# Patient Record
Sex: Male | Born: 1935 | Race: White | Hispanic: No | Smoking: Never smoker
Health system: Southern US, Community
[De-identification: ages and names within clinical notes are randomized; demographics above are authoritative.]

## PROBLEM LIST (undated history)

## (undated) DIAGNOSIS — K635 Polyp of colon: Secondary | ICD-10-CM

## (undated) DIAGNOSIS — K648 Other hemorrhoids: Secondary | ICD-10-CM

## (undated) DIAGNOSIS — I10 Essential (primary) hypertension: Secondary | ICD-10-CM

## (undated) DIAGNOSIS — T7840XA Allergy, unspecified, initial encounter: Secondary | ICD-10-CM

## (undated) HISTORY — DX: Essential (primary) hypertension: I10

## (undated) HISTORY — DX: Polyp of colon: K63.5

## (undated) HISTORY — DX: Other hemorrhoids: K64.8

## (undated) HISTORY — PX: POLYPECTOMY: SHX149

## (undated) HISTORY — DX: Allergy, unspecified, initial encounter: T78.40XA

## (undated) HISTORY — PX: COLONOSCOPY: SHX174

## (undated) HISTORY — PX: LEG AMPUTATION BELOW KNEE: SHX694

---

## 1998-02-18 ENCOUNTER — Emergency Department (HOSPITAL_COMMUNITY): Admission: EM | Admit: 1998-02-18 | Discharge: 1998-02-18 | Payer: Self-pay

## 1998-02-20 ENCOUNTER — Emergency Department (HOSPITAL_COMMUNITY): Admission: EM | Admit: 1998-02-20 | Discharge: 1998-02-20 | Payer: Self-pay | Admitting: Family Medicine

## 1998-03-01 ENCOUNTER — Emergency Department (HOSPITAL_COMMUNITY): Admission: EM | Admit: 1998-03-01 | Discharge: 1998-03-01 | Payer: Self-pay | Admitting: Emergency Medicine

## 2001-05-03 ENCOUNTER — Ambulatory Visit (HOSPITAL_COMMUNITY): Admission: RE | Admit: 2001-05-03 | Discharge: 2001-05-03 | Payer: Self-pay | Admitting: Internal Medicine

## 2001-05-03 ENCOUNTER — Encounter (HOSPITAL_BASED_OUTPATIENT_CLINIC_OR_DEPARTMENT_OTHER): Payer: Self-pay | Admitting: Internal Medicine

## 2002-05-09 ENCOUNTER — Encounter: Admission: RE | Admit: 2002-05-09 | Discharge: 2002-05-09 | Payer: Self-pay | Admitting: Internal Medicine

## 2002-05-09 ENCOUNTER — Encounter: Payer: Self-pay | Admitting: Internal Medicine

## 2002-05-21 ENCOUNTER — Encounter: Admission: RE | Admit: 2002-05-21 | Discharge: 2002-05-21 | Payer: Self-pay | Admitting: Urology

## 2002-05-21 ENCOUNTER — Encounter: Payer: Self-pay | Admitting: Urology

## 2003-08-17 ENCOUNTER — Encounter: Admission: RE | Admit: 2003-08-17 | Discharge: 2003-08-17 | Payer: Self-pay | Admitting: Family Medicine

## 2003-08-19 ENCOUNTER — Encounter: Admission: RE | Admit: 2003-08-19 | Discharge: 2003-08-19 | Payer: Self-pay | Admitting: Family Medicine

## 2004-03-01 ENCOUNTER — Ambulatory Visit: Payer: Self-pay | Admitting: Internal Medicine

## 2004-03-17 ENCOUNTER — Ambulatory Visit: Payer: Self-pay | Admitting: Internal Medicine

## 2004-05-02 ENCOUNTER — Ambulatory Visit: Payer: Self-pay | Admitting: Internal Medicine

## 2004-05-09 ENCOUNTER — Ambulatory Visit: Payer: Self-pay | Admitting: Internal Medicine

## 2004-05-25 ENCOUNTER — Ambulatory Visit: Payer: Self-pay | Admitting: Internal Medicine

## 2004-06-03 ENCOUNTER — Ambulatory Visit: Payer: Self-pay | Admitting: Internal Medicine

## 2005-02-15 ENCOUNTER — Ambulatory Visit: Payer: Self-pay | Admitting: Internal Medicine

## 2005-05-05 ENCOUNTER — Ambulatory Visit: Payer: Self-pay | Admitting: Internal Medicine

## 2005-05-12 ENCOUNTER — Ambulatory Visit: Payer: Self-pay | Admitting: Internal Medicine

## 2005-05-24 ENCOUNTER — Ambulatory Visit: Payer: Self-pay | Admitting: Internal Medicine

## 2005-06-05 ENCOUNTER — Ambulatory Visit: Payer: Self-pay | Admitting: Internal Medicine

## 2005-06-05 ENCOUNTER — Encounter (INDEPENDENT_AMBULATORY_CARE_PROVIDER_SITE_OTHER): Payer: Self-pay | Admitting: *Deleted

## 2005-06-14 ENCOUNTER — Ambulatory Visit: Payer: Self-pay | Admitting: Internal Medicine

## 2005-10-24 ENCOUNTER — Ambulatory Visit: Payer: Self-pay | Admitting: Internal Medicine

## 2005-12-28 IMAGING — US US ABDOMEN COMPLETE
1 series · 13 of 25 positions shown · non-contrast
Comparison: none

CLINICAL DATA: Abdominal pain. 
ABDOMINAL ULTRASOUND ? 08/17/03 
Comparison ? none.
TECHNIQUE: Realtime multiplanar gray scale ultrasonography of the abdomen was performed.

[Series 1: unknown · 0.32mm/px · 13 of 61 slices shown]
[im 1/61]
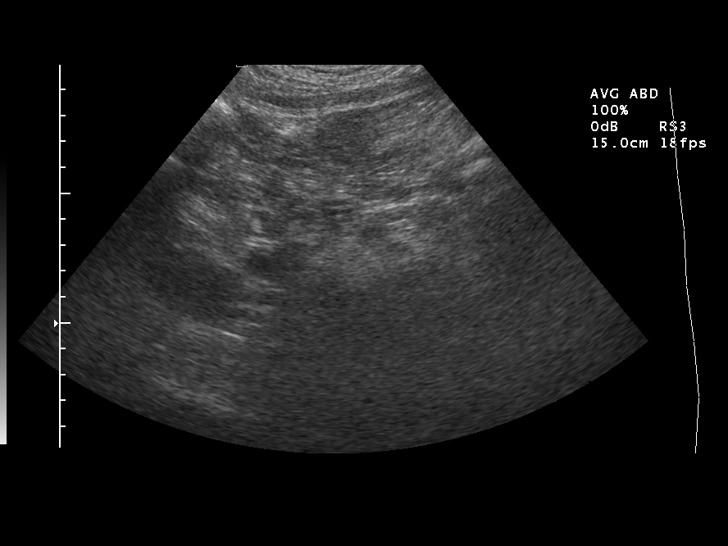
[im 6/61]
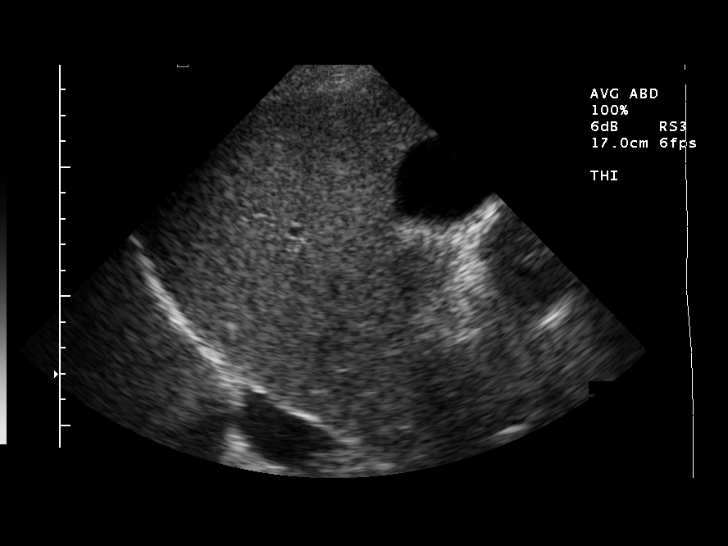
[im 11/61]
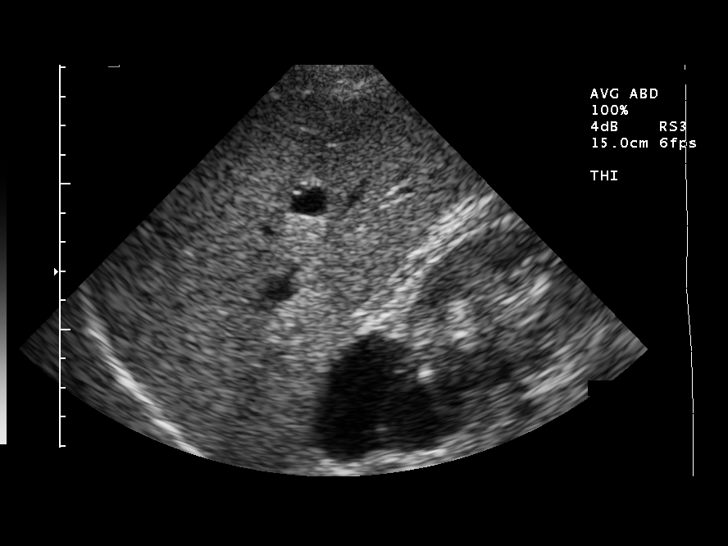
[im 16/61]
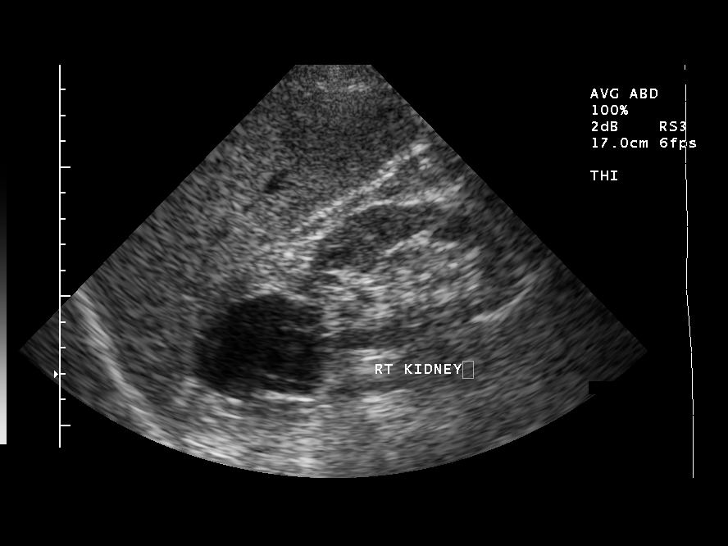
[im 21/61]
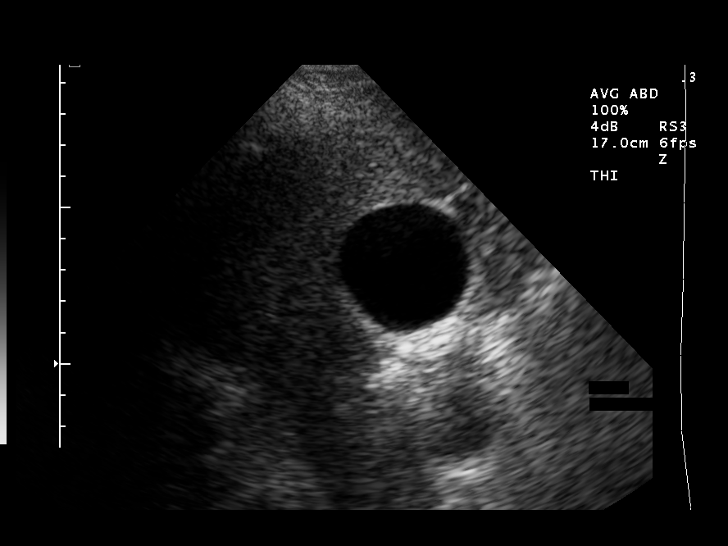
[im 26/61]
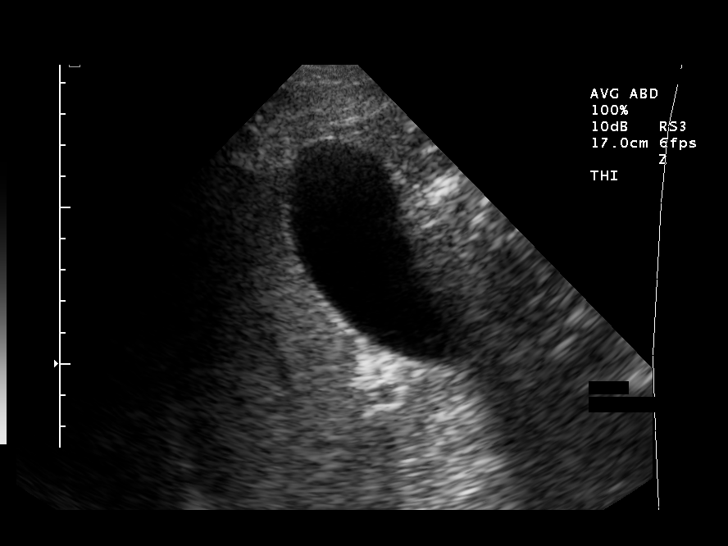
[im 31/61]
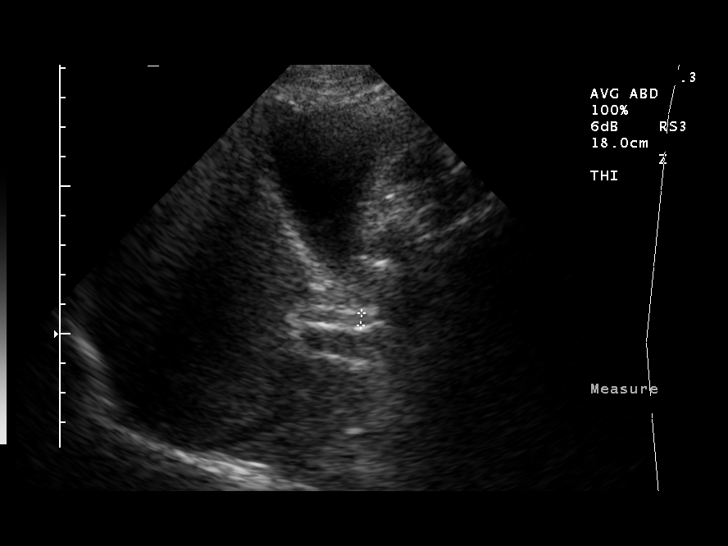
[im 36/61]
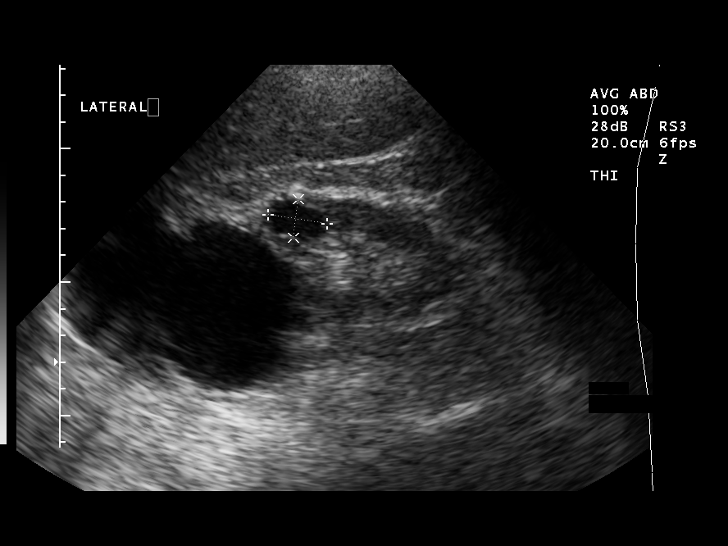
[im 41/61]
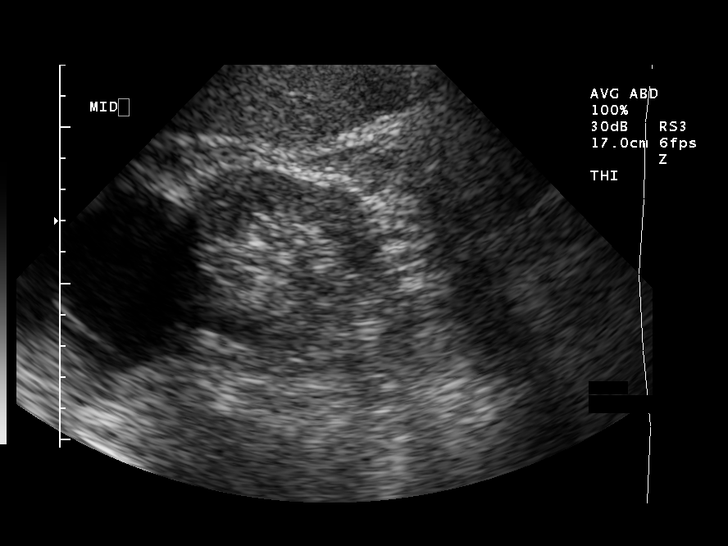
[im 46/61]
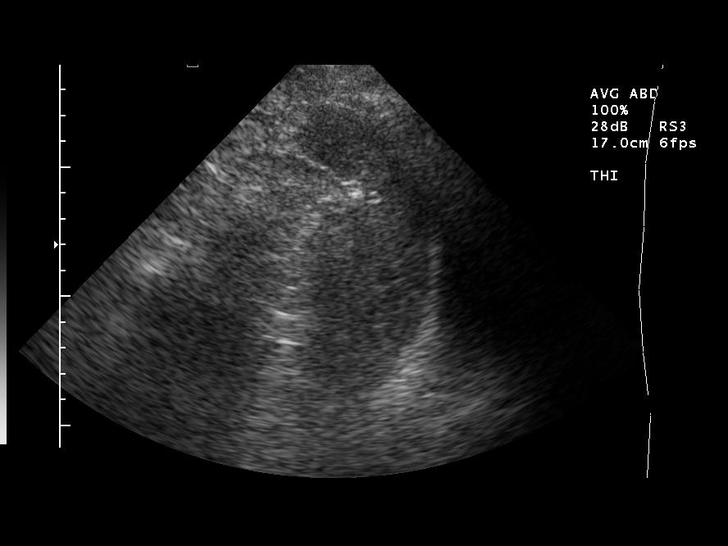
[im 51/61]
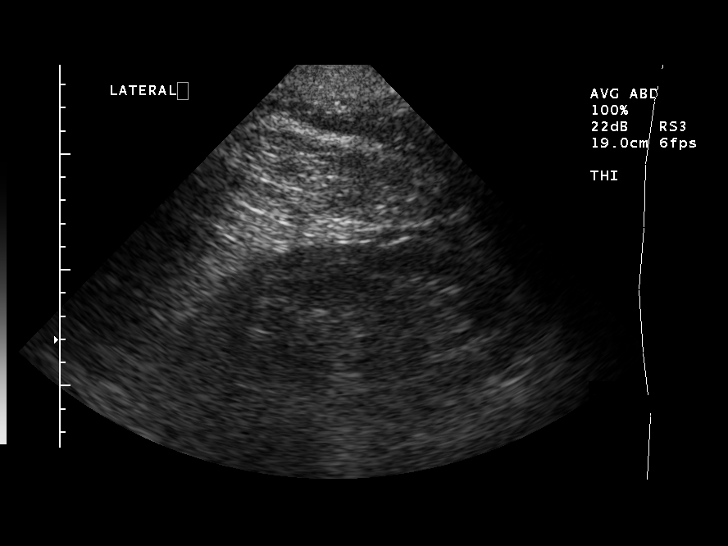
[im 56/61]
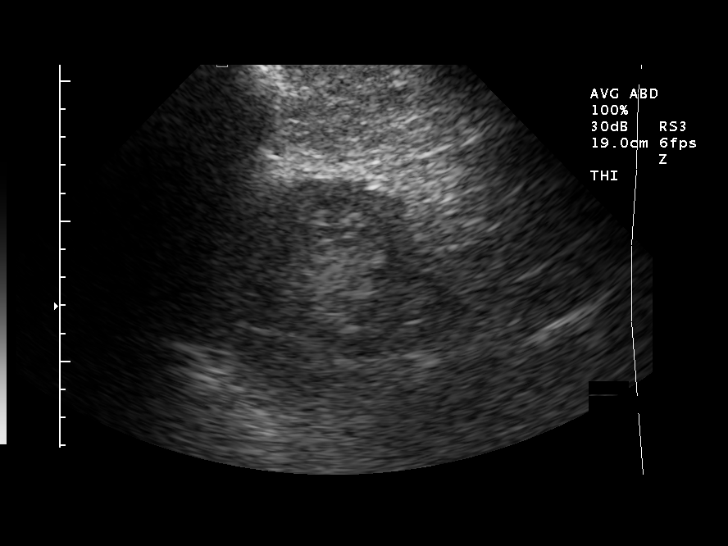
[im 61/61]
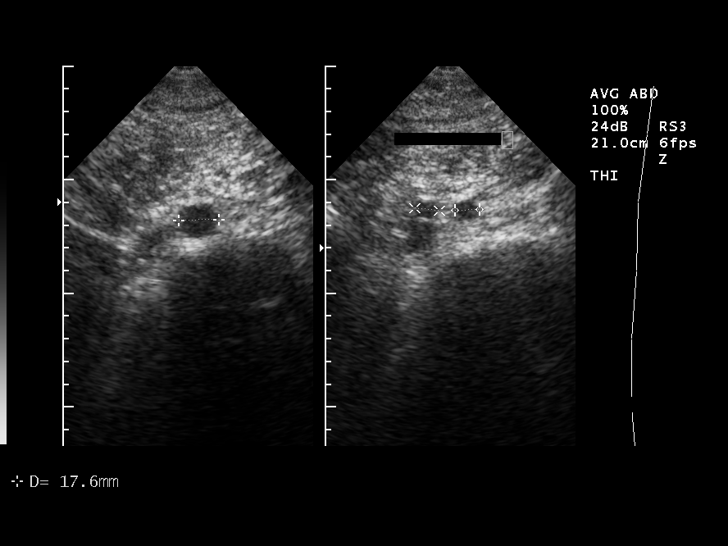

[13 of 25 positions shown; findings below may reference images not displayed]

FINDINGS: A 12 mm cyst is identified in the central liver without other focal intrahepatic parenchymal abnormality.  
Evaluation of the right kidney demonstrates a multilocular cystic lesion in the upper pole measuring 9.6 x 7.4 cm which was larger than the dimensions described on a CT report from 05/21/02.  A second 2.2 cm hypoechoic lesion is identified in the interpolar region of the right kidney.  The left kidney measures 12-13 cm on long axis.  There is no evidence for hydronephrosis on either side. 
Pancreas is obscured by overlying bowel gas.  The cranial portions of the inferior vena cava are unremarkable.  No evidence for abdominal aortic aneurysm.  No focal abnormality identified in the spleen.
Gallbladder has normal features without evidence for gallbladder wall thickening or pericholecystic fluid.  The sonographer measured the gallbladder wall on a longitudinal image with an upper limits of normal measurement, but this may be related to tangential imaging secondary to curvature of the gallbladder wall. 
IMPRESSION
1.  Simple cyst in the hepatic parenchyma.  The gallbladder has normal features.  There is no biliary dilatation.
2.  Large complex cystic lesion in the upper pole of the right kidney has increased in size when compared to measurements from a prior CT scan.  Repeat CT before and after contrast is recommended to further evaluate and allow comparison in the context of the earlier index study.

## 2006-02-22 ENCOUNTER — Ambulatory Visit: Payer: Self-pay | Admitting: Internal Medicine

## 2006-05-07 ENCOUNTER — Ambulatory Visit: Payer: Self-pay | Admitting: Internal Medicine

## 2006-05-07 LAB — CONVERTED CEMR LAB
ALT: 15 units/L (ref 0–40)
AST: 21 units/L (ref 0–37)
Albumin: 3.5 g/dL (ref 3.5–5.2)
Alkaline Phosphatase: 41 units/L (ref 39–117)
BUN: 15 mg/dL (ref 6–23)
Basophils Absolute: 0.3 10*3/uL — ABNORMAL HIGH (ref 0.0–0.1)
Basophils Relative: 2.9 % — ABNORMAL HIGH (ref 0.0–1.0)
CO2: 31 meq/L (ref 19–32)
Calcium: 9.2 mg/dL (ref 8.4–10.5)
Chloride: 104 meq/L (ref 96–112)
Cholesterol: 147 mg/dL (ref 0–200)
Creatinine, Ser: 1.4 mg/dL (ref 0.4–1.5)
Eosinophils Relative: 2 % (ref 0.0–5.0)
GFR calc Af Amer: 64 mL/min
GFR calc non Af Amer: 53 mL/min
Glucose, Bld: 147 mg/dL — ABNORMAL HIGH (ref 70–99)
HCT: 46.9 % (ref 39.0–52.0)
HDL: 33.1 mg/dL — ABNORMAL LOW (ref >39.0)
Hemoglobin: 16.2 g/dL (ref 13.0–17.0)
Hgb A1c MFr Bld: 5.7 % (ref 4.6–6.0)
LDL Cholesterol: 90 mg/dL (ref 0–99)
Lymphocytes Relative: 22.2 % (ref 12.0–46.0)
MCHC: 34.6 g/dL (ref 30.0–36.0)
MCV: 95.9 fL (ref 78.0–100.0)
Monocytes Absolute: 0.4 10*3/uL (ref 0.2–0.7)
Monocytes Relative: 4.6 % (ref 3.0–11.0)
Neutro Abs: 5.9 10*3/uL (ref 1.4–7.7)
Neutrophils Relative %: 68.3 % (ref 43.0–77.0)
PSA: 1.71 ng/mL (ref 0.10–4.00)
Platelets: 189 10*3/uL (ref 150–400)
Potassium: 4.1 meq/L (ref 3.5–5.1)
RBC: 4.9 M/uL (ref 4.22–5.81)
RDW: 12.7 % (ref 11.5–14.6)
Sodium: 140 meq/L (ref 135–145)
TSH: 2.08 microintl units/mL (ref 0.35–5.50)
Total Bilirubin: 0.9 mg/dL (ref 0.3–1.2)
Total CHOL/HDL Ratio: 4.4
Total Protein: 7.1 g/dL (ref 6.0–8.3)
Triglycerides: 121 mg/dL (ref 0–149)
VLDL: 24 mg/dL (ref 0–40)
WBC: 8.8 10*3/uL (ref 4.5–10.5)

## 2006-05-14 ENCOUNTER — Ambulatory Visit: Payer: Self-pay | Admitting: Internal Medicine

## 2006-07-09 ENCOUNTER — Ambulatory Visit: Payer: Self-pay | Admitting: Internal Medicine

## 2006-07-09 LAB — CONVERTED CEMR LAB
CRP, High Sensitivity: 1 (ref 0.00–5.00)
Glucose, Bld: 99 mg/dL (ref 70–99)

## 2006-07-16 ENCOUNTER — Ambulatory Visit: Payer: Self-pay | Admitting: Internal Medicine

## 2006-11-01 DIAGNOSIS — I10 Essential (primary) hypertension: Secondary | ICD-10-CM | POA: Insufficient documentation

## 2006-12-31 ENCOUNTER — Ambulatory Visit: Payer: Self-pay | Admitting: Internal Medicine

## 2006-12-31 DIAGNOSIS — J309 Allergic rhinitis, unspecified: Secondary | ICD-10-CM | POA: Insufficient documentation

## 2006-12-31 DIAGNOSIS — G4700 Insomnia, unspecified: Secondary | ICD-10-CM

## 2007-01-18 ENCOUNTER — Ambulatory Visit: Payer: Self-pay | Admitting: Internal Medicine

## 2007-03-15 ENCOUNTER — Telehealth: Payer: Self-pay | Admitting: Internal Medicine

## 2007-04-22 ENCOUNTER — Telehealth: Payer: Self-pay | Admitting: Internal Medicine

## 2007-05-13 ENCOUNTER — Ambulatory Visit: Payer: Self-pay | Admitting: Internal Medicine

## 2007-05-13 LAB — CONVERTED CEMR LAB
ALT: 18 units/L (ref 0–53)
Alkaline Phosphatase: 38 units/L — ABNORMAL LOW (ref 39–117)
BUN: 17 mg/dL (ref 6–23)
Basophils Absolute: 0 10*3/uL (ref 0.0–0.1)
Bilirubin Urine: NEGATIVE
Calcium: 9.1 mg/dL (ref 8.4–10.5)
Cholesterol: 166 mg/dL (ref 0–200)
Eosinophils Absolute: 0.2 10*3/uL (ref 0.0–0.6)
GFR calc Af Amer: 84 mL/min
GFR calc non Af Amer: 69 mL/min
HDL: 26.7 mg/dL — ABNORMAL LOW (ref >39.0)
Hemoglobin: 15.8 g/dL (ref 13.0–17.0)
Ketones, urine, test strip: NEGATIVE
Lymphocytes Relative: 28.6 % (ref 12.0–46.0)
MCHC: 34 g/dL (ref 30.0–36.0)
MCV: 96.6 fL (ref 78.0–100.0)
Monocytes Absolute: 0.4 10*3/uL (ref 0.2–0.7)
Monocytes Relative: 6.5 % (ref 3.0–11.0)
Neutro Abs: 4 10*3/uL (ref 1.4–7.7)
Nitrite: NEGATIVE
Platelets: 187 10*3/uL (ref 150–400)
Potassium: 4.6 meq/L (ref 3.5–5.1)
Specific Gravity, Urine: 1.02
TSH: 1.83 microintl units/mL (ref 0.35–5.50)
Total Protein: 6.7 g/dL (ref 6.0–8.3)
Triglycerides: 146 mg/dL (ref 0–149)
VLDL: 29 mg/dL (ref 0–40)
pH: 7

## 2007-05-15 ENCOUNTER — Encounter: Payer: Self-pay | Admitting: Internal Medicine

## 2007-05-15 LAB — CONVERTED CEMR LAB: CRP: 0.2 mg/dL (ref <0.6)

## 2007-05-17 ENCOUNTER — Ambulatory Visit: Payer: Self-pay | Admitting: Internal Medicine

## 2007-05-22 ENCOUNTER — Telehealth: Payer: Self-pay | Admitting: Internal Medicine

## 2007-06-20 ENCOUNTER — Telehealth: Payer: Self-pay | Admitting: Internal Medicine

## 2007-06-24 ENCOUNTER — Telehealth: Payer: Self-pay | Admitting: Internal Medicine

## 2010-05-17 ENCOUNTER — Encounter: Payer: Self-pay | Admitting: Internal Medicine

## 2010-05-23 ENCOUNTER — Other Ambulatory Visit: Payer: Self-pay | Admitting: Dermatology

## 2010-05-25 NOTE — Letter (Signed)
Summary: Colonoscopy Letter  [facility]  [address] [place], [place] [address]   Phone: [phone]  Fax: [phone]      May 17, 2010 MRN: [id number]   Ricky West [address] [place], [place]  [address]   Dear Mr. Hahne,   According to your medical record, it is time for you to schedule a Colonoscopy. The American Cancer Society recommends this procedure as a method to detect early colon cancer. Patients with a family history of colon cancer, or a personal history of colon polyps or inflammatory bowel disease are at increased risk.  This letter has been generated based on the recommendations made at the time of your procedure. If you feel that in your particular situation this may no longer apply, please contact our office.  Please call our office at [phone] to schedule this appointment or to update your records at your earliest convenience.  Thank you for cooperating with [place] to provide you with the very best care possible.   Sincerely,  Wilhemina Bonito. Marina Goodell, M.D.  [facility] [phone]

## 2010-06-16 ENCOUNTER — Encounter (INDEPENDENT_AMBULATORY_CARE_PROVIDER_SITE_OTHER): Payer: Self-pay | Admitting: *Deleted

## 2010-06-23 NOTE — Letter (Signed)
Summary: Pre Visit Letter Revised  [facility]  [address] [place], [place] [address]   Phone: [phone]  Fax: [phone]        06/16/2010 MRN: [id number] [facility] [address] [place], [place]  [address]             Procedure Date:  08-01-10           Recall Colon---Dr. Marina Goodell   Welcome to the Gastroenterology Division at [facility].    You are scheduled to see a nurse for your pre-procedure visit on 07-19-10 at 1:30p.m. on the 3rd floor at [facility], [address]  We ask that you try to arrive at our office 15 minutes prior to your appointment time to allow for check-in.  Please take a minute to review the attached form.  If you answer "Yes" to one or more of the questions on the first page, we ask that you call the person listed at your earliest opportunity.  If you answer "No" to all of the questions, please complete the rest of the form and bring it to your appointment.    Your nurse visit will consist of discussing your medical and surgical history, your immediate family medical history, and your medications.   If you are unable to list all of your medications on the form, please bring the medication bottles to your appointment and we will list them.  We will need to be aware of both prescribed and over the counter drugs.  We will need to know exact dosage information as well.    Please be prepared to read and sign documents such as consent forms, a financial agreement, and acknowledgement forms.  If necessary, and with your consent, a friend or relative is welcome to sit-in on the nurse visit with you.  Please bring your insurance card so that we may make a copy of it.  If your insurance requires a referral to see a specialist, please bring your referral form from your primary care physician.  No co-pay is required for this nurse visit.     If you cannot keep your appointment, please call [phone] to cancel or reschedule  prior to your appointment date.  This allows [place] the opportunity to schedule an appointment for another patient in need of care.    Thank you for choosing  Gastroenterology for your medical needs.  We appreciate the opportunity to care for you.  Please visit [place] at our website  to learn more about our practice.  Sincerely, The Gastroenterology Division

## 2010-07-19 ENCOUNTER — Ambulatory Visit (AMBULATORY_SURGERY_CENTER): Payer: Medicare Other | Admitting: *Deleted

## 2010-07-19 VITALS — Ht 71.0 in | Wt 182.0 lb

## 2010-07-19 DIAGNOSIS — Z86010 Personal history of colonic polyps: Secondary | ICD-10-CM

## 2010-07-19 MED ORDER — PEG-KCL-NACL-NASULF-NA ASC-C 100 G PO SOLR: ORAL | Status: DC

## 2010-07-19 NOTE — Progress Notes (Signed)
Elnoria Howard Holt,MD   [address] ,[place], [place] [address] [facility]

## 2010-08-01 ENCOUNTER — Encounter: Payer: Self-pay | Admitting: Internal Medicine

## 2010-08-01 ENCOUNTER — Ambulatory Visit (AMBULATORY_SURGERY_CENTER): Payer: Medicare Other | Admitting: Internal Medicine

## 2010-08-01 VITALS — BP 136/83 | HR 73 | Temp 98.7°F | Resp 20 | Ht 71.0 in | Wt 182.0 lb

## 2010-08-01 DIAGNOSIS — K648 Other hemorrhoids: Secondary | ICD-10-CM

## 2010-08-01 DIAGNOSIS — D126 Benign neoplasm of colon, unspecified: Secondary | ICD-10-CM

## 2010-08-01 DIAGNOSIS — Z1211 Encounter for screening for malignant neoplasm of colon: Secondary | ICD-10-CM

## 2010-08-01 DIAGNOSIS — Z86010 Personal history of colonic polyps: Secondary | ICD-10-CM

## 2010-08-01 MED ORDER — SODIUM CHLORIDE 0.9 % IV SOLN: 500.0000 mL | INTRAVENOUS | Status: AC

## 2010-08-01 NOTE — Patient Instructions (Signed)
DC INSTRUCTIONS REVIEWED WITH PT. & CAREPARTNER DISCUSSING POLYPS,HEMORRHOIDS ,RECOMMENDED DIET FOR TODAY & SAFETY PRECAUTIONS DUE TO SEDATION GIVEN TODAY.

## 2010-08-02 ENCOUNTER — Telehealth: Payer: Self-pay | Admitting: *Deleted

## 2010-08-02 NOTE — Telephone Encounter (Signed)

## 2013-06-06 ENCOUNTER — Encounter: Payer: Self-pay | Admitting: Internal Medicine

## 2013-07-15 ENCOUNTER — Encounter: Payer: Self-pay | Admitting: Internal Medicine

## 2013-07-22 ENCOUNTER — Encounter: Payer: Self-pay | Admitting: Internal Medicine

## 2013-07-22 ENCOUNTER — Ambulatory Visit (INDEPENDENT_AMBULATORY_CARE_PROVIDER_SITE_OTHER): Payer: Medicare Other | Admitting: Internal Medicine

## 2013-07-22 VITALS — BP 124/72 | HR 88 | Ht 69.5 in | Wt 188.0 lb

## 2013-07-22 DIAGNOSIS — K5900 Constipation, unspecified: Secondary | ICD-10-CM

## 2013-07-22 DIAGNOSIS — Z860100 Personal history of colon polyps, unspecified: Secondary | ICD-10-CM

## 2013-07-22 DIAGNOSIS — R195 Other fecal abnormalities: Secondary | ICD-10-CM

## 2013-07-22 MED ORDER — MOVIPREP 100 G PO SOLR: 1.0000 | Freq: Once | ORAL | Status: DC

## 2013-07-22 NOTE — Patient Instructions (Signed)
You have been given a blank set of instructions for your colonoscopy.  I will call you to schedule it in the next week

## 2013-07-22 NOTE — Progress Notes (Signed)
HISTORY OF PRESENT ILLNESS:  Ricky West is a 80 y.o. male with mild hypertension and a history of adenomatous colon polyps who presents today regarding Hemoccult-positive stool. Patient underwent colonoscopy in 2007 (tubular adenomas) and again in April of 2012 (tubulovillous adenoma). No routine followup recommended. He recently saw his family physician for physical examination. Digital rectal exam revealed Hemoccult-positive stool. Other blood work was reported as normal. He was encouraged to followup with me at this time to discuss possible colonoscopy. The patient's only GI complaint is constipation which she attributes to the recent introduction of Cialis. No bleeding, weight loss, or other complaints. He remains extremely active. Planning a three-week cruise to [place]. REVIEW OF SYSTEMS:  All non-GI ROS negative except for sinus and allergy trouble, urinary frequency, visual change  Past Medical History  Diagnosis Date  . Allergy     Gluten and dairy products  . Hypertension     borderline  . Colon polyps     adenomatous  . Internal hemorrhoids     Past Surgical History  Procedure Laterality Date  . Leg amputation below knee Right   . Colonoscopy    . Polypectomy      Social History Ricky West  reports that he has never smoked. He has never used smokeless tobacco. He reports that he does not drink alcohol or use illicit drugs.  family history includes Heart disease in his maternal aunt, maternal uncle, and mother.  Allergies  Allergen Reactions  . Milk-Related Compounds        PHYSICAL EXAMINATION: Vital signs: BP 124/72  Pulse 88  Ht 5' 9.5" (1.765 m)  Wt 188 lb (85.276 kg)  BMI 27.37 kg/m2  Constitutional: generally well-appearing, no acute distress Psychiatric: alert and oriented x3, cooperative Eyes: extraocular movements intact, anicteric, conjunctiva pink Mouth: oral pharynx moist, no lesions Neck: supple no lymphadenopathy Cardiovascular: heart regular rate  and rhythm, no murmur Lungs: clear to auscultation bilaterally Abdomen: soft, nontender, nondistended, no obvious ascites, no peritoneal signs, normal bowel sounds, no organomegaly Rectal: Deferred until colonoscopy Extremities: no lower extremity edema. Status post right AKA Skin: no lesions on visible extremities Neuro: No focal deficits.   ASSESSMENT:  #1. Hemoccult-positive stool. #2. Constipation. Likely functional #3. History of adenoma and tubulovillous adenoma. Last examination 3 years ago   PLAN:  #1. We discussed the pros and cons of proceeding with colonoscopy to work up the Hemoccult-positive stool. Given the fact that he had advanced adenoma 3 years ago and he is in excellent physical condition, he was interested in proceeding with colonoscopy. I completely support this decision.The nature of the procedure, as well as the risks, benefits, and alternatives were carefully and thoroughly reviewed with the patient. Ample time for discussion and questions allowed. The patient understood, was satisfied, and agreed to proceed. He wishes to wait until mid to late June after returning from [place] #2. Movi prep prescribed. Patient instructed on its use #3. Fiber a lot of constipation

## 2013-07-24 ENCOUNTER — Telehealth: Payer: Self-pay

## 2013-07-24 NOTE — Telephone Encounter (Signed)
Patient was waiting for Dr. Blanch Media June schedule to come out - called patient and scheduled colonoscopy for the date he requested.  Also told him I would reprint instructions with the correct date and times to avoid confusion and mail them to him. Relayed the insurance information I had gotten from [place] regarding what his insurance might pay and gave him her number if he wanted to clarify the information.  Patient acknowledged and understood

## 2013-09-30 ENCOUNTER — Encounter: Payer: Self-pay | Admitting: Internal Medicine

## 2013-09-30 NOTE — Telephone Encounter (Signed)
Error

## 2013-10-08 ENCOUNTER — Encounter: Payer: Self-pay | Admitting: Internal Medicine

## 2013-10-08 ENCOUNTER — Ambulatory Visit (AMBULATORY_SURGERY_CENTER): Payer: Medicare Other | Admitting: Internal Medicine

## 2013-10-08 VITALS — BP 140/81 | HR 68 | Temp 98.0°F | Resp 22 | Ht 69.0 in | Wt 188.0 lb

## 2013-10-08 DIAGNOSIS — R195 Other fecal abnormalities: Secondary | ICD-10-CM

## 2013-10-08 DIAGNOSIS — Z86010 Personal history of colonic polyps: Secondary | ICD-10-CM

## 2013-10-08 MED ORDER — SODIUM CHLORIDE 0.9 % IV SOLN: 500.0000 mL | INTRAVENOUS | Status: DC

## 2013-10-08 NOTE — Progress Notes (Signed)
A/ox3, pleased with MAC, report to RN 

## 2013-10-08 NOTE — Patient Instructions (Signed)
YOU HAD AN ENDOSCOPIC PROCEDURE TODAY AT [facility]: Refer to the procedure report that was given to you for any specific questions about what was found during the examination.  If the procedure report does not answer your questions, please call your gastroenterologist to clarify.  If you requested that your care partner not be given the details of your procedure findings, then the procedure report has been included in a sealed envelope for you to review at your convenience later.  YOU SHOULD EXPECT: Some feelings of bloating in the abdomen. Passage of more gas than usual.  Walking can help get rid of the air that was put into your GI tract during the procedure and reduce the bloating. If you had a lower endoscopy (such as a colonoscopy or flexible sigmoidoscopy) you may notice spotting of blood in your stool or on the toilet paper. If you underwent a bowel prep for your procedure, then you may not have a normal bowel movement for a few days.  DIET: Your first meal following the procedure should be a light meal and then it is ok to progress to your normal diet.  A half-sandwich or bowl of soup is an example of a good first meal.  Heavy or fried foods are harder to digest and may make you feel nauseous or bloated.  Likewise meals heavy in dairy and vegetables can cause extra gas to form and this can also increase the bloating.  Drink plenty of fluids but you should avoid alcoholic beverages for 24 hours.  ACTIVITY: Your care partner should take you home directly after the procedure.  You should plan to take it easy, moving slowly for the rest of the day.  You can resume normal activity the day after the procedure however you should NOT DRIVE or use heavy machinery for 24 hours (because of the sedation medicines used during the test).    SYMPTOMS TO REPORT IMMEDIATELY: A gastroenterologist can be reached at any hour.  During normal business hours, 8:30 AM to 5:00 PM Monday through Friday,  call [phone].  After hours and on weekends, please call the GI answering service at [phone] who will take a message and have the physician on call contact you.   Following lower endoscopy (colonoscopy or flexible sigmoidoscopy):  Excessive amounts of blood in the stool  Significant tenderness or worsening of abdominal pains  Swelling of the abdomen that is new, acute  Fever of 100F or higher  FOLLOW UP: Our staff will call the home number listed on your records the next business day following your procedure to check on you and address any questions or concerns that you may have at that time regarding the information given to you following your procedure. This is a courtesy call and so if there is no answer at the home number and we have not heard from you through the emergency physician on call, we will assume that you have returned to your regular daily activities without incident.  SIGNATURES/CONFIDENTIALITY: You and/or your care partner have signed paperwork which will be entered into your electronic medical record.  These signatures attest to the fact that that the information above on your After Visit Summary has been reviewed and is understood.  Full responsibility of the confidentiality of this discharge information lies with you and/or your care-partner.  Please continue your normal medications  Please read over handouts about diverticulosis and high fiber diets  Follow up with Dr. Henrene Pastor only as needed

## 2013-10-08 NOTE — Op Note (Signed)
[place]  [address] [place], [address]   COLONOSCOPY PROCEDURE REPORT  PATIENT: Ricky West, Ricky West  MR#: [id number] BIRTHDATE: 07/21/1929 , 83  yrs. old GENDER: Male ENDOSCOPIST: Eustace Quail, MD REFERRED BY:.  Self / Office PROCEDURE DATE:  10/08/2013 PROCEDURE:   Colonoscopy, diagnostic First Screening Colonoscopy - Avg.  risk and is 50 yrs.  old or older - No.  Prior Negative Screening - Now for repeat screening. N/A  History of Adenoma - Now for follow-up colonoscopy & has been > or = to 3 yrs.  Yes hx of adenoma.  Has been 3 or more years since last colonoscopy.  Polyps Removed Today? No.  Recommend repeat exam, <10 yrs? No. ASA CLASS:   Class II INDICATIONS:Patient's personal history of adenomatous colon polyps and heme-positive stool. Prior examinations 2007 (tubular adenoma) and 2012 (tubulovillous adenoma). MEDICATIONS: MAC sedation, administered by CRNA and propofol (Diprivan) 150mg  IV  DESCRIPTION OF PROCEDURE:   After the risks benefits and alternatives of the procedure were thoroughly explained, informed consent was obtained.  A digital rectal exam revealed no abnormalities of the rectum.   The LB [id number]  endoscope was introduced through the anus and advanced to the cecum, which was identified by both the appendix and ileocecal valve. No adverse events experienced.   The quality of the prep was good, using MoviPrep  The instrument was then slowly withdrawn as the colon was fully examined.   COLON FINDINGS: Moderate diverticulosis was noted in the ascending colon and sigmoid colon.   The colon was otherwise normal.  There was no  inflammation, polyps or cancers unless previously stated. Retroflexed views revealed internal hemorrhoids. The time to cecum=4 minutes 24 seconds.  Withdrawal time=9 minutes 56 seconds. The scope was withdrawn and the procedure completed. COMPLICATIONS: There were no complications.  ENDOSCOPIC  IMPRESSION: 1.   Moderate diverticulosis was noted in the ascending colon and sigmoid colon 2.   The colon was otherwise normal  RECOMMENDATIONS: 1. Return to the care of your primary Mads Borgmeyer.  GI follow up as needed   eSigned:  Eustace Quail, MD 10/08/2013 1:23 PM   cc: The Patient

## 2013-10-09 ENCOUNTER — Telehealth: Payer: Self-pay

## 2013-10-09 NOTE — Telephone Encounter (Signed)
  Follow up Call-  Call back number 10/08/2013  Post procedure Call Back phone  # [phone]  Permission to leave phone message Yes     Patient questions:  Do you have a fever, pain , or abdominal swelling? no Pain Score  0 *  Have you tolerated food without any problems? yes  Have you been able to return to your normal activities? yes  Do you have any questions about your discharge instructions: Diet   no Medications  no Follow up visit  no  Do you have questions or concerns about your Care? no  Actions: * If pain score is 4 or above: No action needed, pain <4.  Per the pt, "I could not have had better care".  He thanked everyone! maw

## 2014-11-23 ENCOUNTER — Other Ambulatory Visit: Payer: Self-pay | Admitting: Dermatology

## 2015-08-26 ENCOUNTER — Encounter: Payer: Self-pay | Admitting: Internal Medicine

## 2016-08-03 ENCOUNTER — Other Ambulatory Visit: Payer: Self-pay | Admitting: Dermatology

## 2022-11-16 DEATH — deceased
